# Patient Record
Sex: Male | Born: 1965
Health system: Southern US, Community
[De-identification: ages and names within clinical notes are randomized; demographics above are authoritative.]

## PROBLEM LIST (undated history)

## (undated) DIAGNOSIS — M549 Dorsalgia, unspecified: Secondary | ICD-10-CM

## (undated) DIAGNOSIS — E7800 Pure hypercholesterolemia, unspecified: Secondary | ICD-10-CM

## (undated) DIAGNOSIS — F329 Major depressive disorder, single episode, unspecified: Secondary | ICD-10-CM

## (undated) DIAGNOSIS — F32A Depression, unspecified: Secondary | ICD-10-CM

---

## 2007-11-28 ENCOUNTER — Emergency Department (HOSPITAL_COMMUNITY): Admission: EM | Admit: 2007-11-28 | Discharge: 2007-11-28 | Payer: Self-pay | Admitting: Emergency Medicine

## 2007-12-30 ENCOUNTER — Ambulatory Visit: Payer: Self-pay | Admitting: Family Medicine

## 2007-12-30 DIAGNOSIS — F329 Major depressive disorder, single episode, unspecified: Secondary | ICD-10-CM

## 2007-12-30 DIAGNOSIS — M546 Pain in thoracic spine: Secondary | ICD-10-CM

## 2008-01-08 LAB — CONVERTED CEMR LAB
Calcium: 10 mg/dL (ref 8.4–10.5)
Creatinine, Ser: 0.9 mg/dL (ref 0.4–1.5)
GFR calc Af Amer: 121 mL/min
GFR calc non Af Amer: 100 mL/min
Glucose, Bld: 85 mg/dL (ref 70–99)
MCHC: 34.2 g/dL (ref 30.0–36.0)
Platelets: 310 10*3/uL (ref 150–400)
RBC: 4.85 M/uL (ref 4.22–5.81)
RDW: 11.9 % (ref 11.5–14.6)
WBC: 4.3 10*3/uL — ABNORMAL LOW (ref 4.5–10.5)

## 2008-01-09 ENCOUNTER — Encounter (INDEPENDENT_AMBULATORY_CARE_PROVIDER_SITE_OTHER): Payer: Self-pay | Admitting: *Deleted

## 2008-03-23 ENCOUNTER — Emergency Department (HOSPITAL_COMMUNITY): Admission: EM | Admit: 2008-03-23 | Discharge: 2008-03-23 | Payer: Self-pay | Admitting: Family Medicine

## 2009-10-07 ENCOUNTER — Emergency Department (HOSPITAL_COMMUNITY): Admission: EM | Admit: 2009-10-07 | Discharge: 2009-10-07 | Payer: Self-pay | Admitting: Family Medicine

## 2010-03-31 ENCOUNTER — Ambulatory Visit (HOSPITAL_COMMUNITY)
Admission: RE | Admit: 2010-03-31 | Discharge: 2010-03-31 | Payer: Self-pay | Admitting: Physical Medicine and Rehabilitation

## 2010-03-31 ENCOUNTER — Encounter
Admission: RE | Admit: 2010-03-31 | Discharge: 2010-06-29 | Payer: Self-pay | Admitting: Physical Medicine and Rehabilitation

## 2010-11-29 ENCOUNTER — Ambulatory Visit (INDEPENDENT_AMBULATORY_CARE_PROVIDER_SITE_OTHER): Payer: 59

## 2010-11-29 ENCOUNTER — Inpatient Hospital Stay (INDEPENDENT_AMBULATORY_CARE_PROVIDER_SITE_OTHER)
Admission: RE | Admit: 2010-11-29 | Discharge: 2010-11-29 | Disposition: A | Discharge status: Home / Self Care | Payer: 59 | Source: Ambulatory Visit | Attending: Emergency Medicine | Admitting: Emergency Medicine

## 2010-11-29 DIAGNOSIS — M79609 Pain in unspecified limb: Secondary | ICD-10-CM

## 2011-06-28 ENCOUNTER — Other Ambulatory Visit (HOSPITAL_COMMUNITY): Payer: Self-pay | Admitting: Physical Medicine and Rehabilitation

## 2011-06-28 DIAGNOSIS — R52 Pain, unspecified: Secondary | ICD-10-CM

## 2011-07-05 ENCOUNTER — Ambulatory Visit (HOSPITAL_COMMUNITY)
Admission: RE | Admit: 2011-07-05 | Discharge: 2011-07-05 | Disposition: A | Discharge status: Home / Self Care | Payer: 59 | Source: Ambulatory Visit | Attending: Physical Medicine and Rehabilitation | Admitting: Physical Medicine and Rehabilitation

## 2011-07-05 DIAGNOSIS — R52 Pain, unspecified: Secondary | ICD-10-CM

## 2011-07-05 DIAGNOSIS — M546 Pain in thoracic spine: Secondary | ICD-10-CM | POA: Insufficient documentation

## 2011-09-19 ENCOUNTER — Encounter: Payer: Self-pay | Admitting: *Deleted

## 2011-09-19 MED ORDER — SODIUM CHLORIDE 0.9 % IV BOLUS (SEPSIS)
1000.0000 mL | Freq: Once | INTRAVENOUS | Status: AC
  Administered 2011-09-20: 1000 mL via INTRAVENOUS

## 2011-09-19 MED ORDER — ONDANSETRON HCL 4 MG/2ML IJ SOLN
4.0000 mg | Freq: Once | INTRAMUSCULAR | Status: AC
  Administered 2011-09-20: 4 mg via INTRAVENOUS
  Filled 2011-09-19: qty 2

## 2011-09-19 NOTE — ED Notes (Signed)
Pt presents to ED today with abd pain and n/v since 2pm.  Pt tried rx tramadol with no relief

## 2011-09-20 ENCOUNTER — Encounter (HOSPITAL_COMMUNITY): Admission: EM | Disposition: A | Discharge status: Home / Self Care | Payer: Self-pay | Source: Home / Self Care

## 2011-09-20 ENCOUNTER — Encounter (HOSPITAL_COMMUNITY): Payer: Self-pay | Admitting: Anesthesiology

## 2011-09-20 ENCOUNTER — Inpatient Hospital Stay (HOSPITAL_BASED_OUTPATIENT_CLINIC_OR_DEPARTMENT_OTHER)
Admission: EM | Admit: 2011-09-20 | Discharge: 2011-09-21 | DRG: 419 | Disposition: A | Discharge status: Home / Self Care | Payer: 59 | Attending: Surgery | Admitting: Surgery

## 2011-09-20 ENCOUNTER — Encounter (HOSPITAL_COMMUNITY): Payer: Self-pay | Admitting: Emergency Medicine

## 2011-09-20 ENCOUNTER — Emergency Department (INDEPENDENT_AMBULATORY_CARE_PROVIDER_SITE_OTHER): Payer: 59

## 2011-09-20 ENCOUNTER — Other Ambulatory Visit (INDEPENDENT_AMBULATORY_CARE_PROVIDER_SITE_OTHER): Payer: Self-pay | Admitting: General Surgery

## 2011-09-20 ENCOUNTER — Emergency Department (HOSPITAL_COMMUNITY): Payer: 59 | Admitting: Anesthesiology

## 2011-09-20 ENCOUNTER — Emergency Department (HOSPITAL_COMMUNITY): Payer: 59

## 2011-09-20 ENCOUNTER — Other Ambulatory Visit: Payer: Self-pay

## 2011-09-20 DIAGNOSIS — K8020 Calculus of gallbladder without cholecystitis without obstruction: Secondary | ICD-10-CM | POA: Diagnosis present

## 2011-09-20 DIAGNOSIS — K8010 Calculus of gallbladder with chronic cholecystitis without obstruction: Secondary | ICD-10-CM

## 2011-09-20 DIAGNOSIS — E7800 Pure hypercholesterolemia, unspecified: Secondary | ICD-10-CM | POA: Diagnosis present

## 2011-09-20 DIAGNOSIS — F329 Major depressive disorder, single episode, unspecified: Secondary | ICD-10-CM | POA: Diagnosis present

## 2011-09-20 DIAGNOSIS — F411 Generalized anxiety disorder: Secondary | ICD-10-CM | POA: Diagnosis present

## 2011-09-20 DIAGNOSIS — F3289 Other specified depressive episodes: Secondary | ICD-10-CM | POA: Diagnosis present

## 2011-09-20 DIAGNOSIS — R109 Unspecified abdominal pain: Secondary | ICD-10-CM

## 2011-09-20 DIAGNOSIS — M549 Dorsalgia, unspecified: Secondary | ICD-10-CM | POA: Diagnosis present

## 2011-09-20 DIAGNOSIS — K8000 Calculus of gallbladder with acute cholecystitis without obstruction: Principal | ICD-10-CM | POA: Diagnosis present

## 2011-09-20 DIAGNOSIS — G8929 Other chronic pain: Secondary | ICD-10-CM | POA: Diagnosis present

## 2011-09-20 DIAGNOSIS — R112 Nausea with vomiting, unspecified: Secondary | ICD-10-CM

## 2011-09-20 DIAGNOSIS — K8050 Calculus of bile duct without cholangitis or cholecystitis without obstruction: Secondary | ICD-10-CM

## 2011-09-20 HISTORY — DX: Depression, unspecified: F32.A

## 2011-09-20 HISTORY — PX: CHOLECYSTECTOMY: SHX55

## 2011-09-20 HISTORY — DX: Dorsalgia, unspecified: M54.9

## 2011-09-20 HISTORY — DX: Pure hypercholesterolemia, unspecified: E78.00

## 2011-09-20 HISTORY — DX: Major depressive disorder, single episode, unspecified: F32.9

## 2011-09-20 LAB — CBC
HCT: 40.1 % (ref 39.0–52.0)
MCH: 30.9 pg (ref 26.0–34.0)
MCV: 87.2 fL (ref 78.0–100.0)
RBC: 4.6 MIL/uL (ref 4.22–5.81)
RDW: 12.5 % (ref 11.5–15.5)
WBC: 12.6 10*3/uL — ABNORMAL HIGH (ref 4.0–10.5)

## 2011-09-20 LAB — COMPREHENSIVE METABOLIC PANEL
AST: 31 U/L (ref 0–37)
Albumin: 4.9 g/dL (ref 3.5–5.2)
Alkaline Phosphatase: 95 U/L (ref 39–117)
BUN: 9 mg/dL (ref 6–23)
Creatinine, Ser: 0.9 mg/dL (ref 0.50–1.35)
Glucose, Bld: 151 mg/dL — ABNORMAL HIGH (ref 70–99)
Potassium: 3.9 mEq/L (ref 3.5–5.1)
Sodium: 142 mEq/L (ref 135–145)

## 2011-09-20 LAB — DIFFERENTIAL
Basophils Absolute: 0 10*3/uL (ref 0.0–0.1)
Eosinophils Relative: 0 % (ref 0–5)
Lymphocytes Relative: 7 % — ABNORMAL LOW (ref 12–46)
Lymphs Abs: 0.9 10*3/uL (ref 0.7–4.0)
Monocytes Absolute: 0.8 10*3/uL (ref 0.1–1.0)
Monocytes Relative: 6 % (ref 3–12)

## 2011-09-20 LAB — URINALYSIS, ROUTINE W REFLEX MICROSCOPIC
Bilirubin Urine: NEGATIVE
Hgb urine dipstick: NEGATIVE
Leukocytes, UA: NEGATIVE
Nitrite: NEGATIVE
Urobilinogen, UA: 1 mg/dL (ref 0.0–1.0)

## 2011-09-20 SURGERY — LAPAROSCOPIC CHOLECYSTECTOMY
Anesthesia: General | Site: Abdomen | Wound class: Clean Contaminated

## 2011-09-20 MED ORDER — ONDANSETRON HCL 4 MG/2ML IJ SOLN
4.0000 mg | Freq: Four times a day (QID) | INTRAMUSCULAR | Status: DC | PRN
  Administered 2011-09-20: 4 mg via INTRAVENOUS
  Filled 2011-09-20 (×2): qty 2

## 2011-09-20 MED ORDER — LACTATED RINGERS IV SOLN: INTRAVENOUS | Status: DC

## 2011-09-20 MED ORDER — KETOROLAC TROMETHAMINE 30 MG/ML IJ SOLN: 15.0000 mg | Freq: Once | INTRAMUSCULAR | Status: DC | PRN

## 2011-09-20 MED ORDER — MORPHINE SULFATE 2 MG/ML IJ SOLN
2.0000 mg | INTRAMUSCULAR | Status: DC | PRN
  Administered 2011-09-20: 2 mg via INTRAVENOUS
  Filled 2011-09-20: qty 1

## 2011-09-20 MED ORDER — IOHEXOL 300 MG/ML  SOLN
INTRAMUSCULAR | Status: DC | PRN
  Administered 2011-09-20: 6 mL via INTRAVENOUS

## 2011-09-20 MED ORDER — ROCURONIUM BROMIDE 100 MG/10ML IV SOLN
INTRAVENOUS | Status: DC | PRN
  Administered 2011-09-20: 5 mg via INTRAVENOUS
  Administered 2011-09-20: 40 mg via INTRAVENOUS

## 2011-09-20 MED ORDER — IOHEXOL 300 MG/ML  SOLN
100.0000 mL | Freq: Once | INTRAMUSCULAR | Status: AC | PRN
  Administered 2011-09-20: 100 mL via INTRAVENOUS

## 2011-09-20 MED ORDER — INFLUENZA VIRUS VACC SPLIT PF IM SUSP
0.5000 mL | INTRAMUSCULAR | Status: AC
  Administered 2011-09-21: 0.5 mL via INTRAMUSCULAR
  Filled 2011-09-20: qty 0.5

## 2011-09-20 MED ORDER — BUPIVACAINE-EPINEPHRINE 0.25% -1:200000 IJ SOLN
INTRAMUSCULAR | Status: DC | PRN
  Administered 2011-09-20: 10 mL

## 2011-09-20 MED ORDER — ONDANSETRON HCL 4 MG PO TABS: 4.0000 mg | ORAL_TABLET | Freq: Four times a day (QID) | ORAL | Status: DC | PRN

## 2011-09-20 MED ORDER — POTASSIUM CHLORIDE IN NACL 20-0.9 MEQ/L-% IV SOLN
INTRAVENOUS | Status: DC
  Filled 2011-09-20 (×4): qty 1000

## 2011-09-20 MED ORDER — GLYCOPYRROLATE 0.2 MG/ML IJ SOLN
INTRAMUSCULAR | Status: DC | PRN
  Administered 2011-09-20: .7 mg via INTRAVENOUS

## 2011-09-20 MED ORDER — HYDROMORPHONE HCL PF 1 MG/ML IJ SOLN
1.0000 mg | INTRAMUSCULAR | Status: DC | PRN
  Administered 2011-09-20 (×5): 1 mg via INTRAVENOUS
  Filled 2011-09-20 (×4): qty 1

## 2011-09-20 MED ORDER — PROPOFOL 10 MG/ML IV BOLUS
INTRAVENOUS | Status: DC | PRN
  Administered 2011-09-20: 180 mg via INTRAVENOUS

## 2011-09-20 MED ORDER — PROMETHAZINE HCL 25 MG/ML IJ SOLN: 6.2500 mg | INTRAMUSCULAR | Status: DC | PRN

## 2011-09-20 MED ORDER — HYDROMORPHONE HCL PF 1 MG/ML IJ SOLN
INTRAMUSCULAR | Status: DC | PRN
  Administered 2011-09-20 (×2): 0.5 mg via INTRAVENOUS
  Administered 2011-09-20: 1 mg via INTRAVENOUS

## 2011-09-20 MED ORDER — LACTATED RINGERS IV SOLN
INTRAVENOUS | Status: DC | PRN
  Administered 2011-09-20: 10:00:00 via INTRAVENOUS

## 2011-09-20 MED ORDER — PIPERACILLIN-TAZOBACTAM 3.375 G IVPB 30 MIN
3.3750 g | INTRAVENOUS | Status: AC
  Administered 2011-09-20: 3.375 g via INTRAVENOUS
  Filled 2011-09-20: qty 50

## 2011-09-20 MED ORDER — NEOSTIGMINE METHYLSULFATE 1 MG/ML IJ SOLN
INTRAMUSCULAR | Status: DC | PRN
  Administered 2011-09-20: 4 mg via INTRAVENOUS

## 2011-09-20 MED ORDER — MIDAZOLAM HCL 5 MG/5ML IJ SOLN
INTRAMUSCULAR | Status: DC | PRN
  Administered 2011-09-20: 2 mg via INTRAVENOUS

## 2011-09-20 MED ORDER — ACETAMINOPHEN 10 MG/ML IV SOLN
INTRAVENOUS | Status: DC | PRN
  Administered 2011-09-20: 1000 mg via INTRAVENOUS

## 2011-09-20 MED ORDER — IOHEXOL 300 MG/ML  SOLN
INTRAMUSCULAR | Status: AC
  Filled 2011-09-20: qty 1

## 2011-09-20 MED ORDER — CITALOPRAM HYDROBROMIDE 20 MG PO TABS
20.0000 mg | ORAL_TABLET | Freq: Every day | ORAL | Status: DC
Start: 2011-09-20 — End: 2011-09-21
  Administered 2011-09-20 – 2011-09-21 (×2): 20 mg via ORAL
  Filled 2011-09-20 (×2): qty 1

## 2011-09-20 MED ORDER — MORPHINE SULFATE 2 MG/ML IJ SOLN
INTRAMUSCULAR | Status: AC
  Administered 2011-09-20: 2 mg via INTRAVENOUS
  Filled 2011-09-20: qty 1

## 2011-09-20 MED ORDER — EZETIMIBE 10 MG PO TABS
10.0000 mg | ORAL_TABLET | Freq: Every day | ORAL | Status: DC
Start: 2011-09-20 — End: 2011-09-21
  Administered 2011-09-20: 10 mg via ORAL
  Filled 2011-09-20 (×2): qty 1

## 2011-09-20 MED ORDER — METRONIDAZOLE 500 MG PO TABS: 500.0000 mg | ORAL_TABLET | Freq: Once | ORAL | Status: DC

## 2011-09-20 MED ORDER — MORPHINE SULFATE 2 MG/ML IJ SOLN
2.0000 mg | INTRAMUSCULAR | Status: DC | PRN
  Administered 2011-09-20: 2 mg via INTRAVENOUS

## 2011-09-20 MED ORDER — ROSUVASTATIN CALCIUM 40 MG PO TABS
40.0000 mg | ORAL_TABLET | Freq: Every day | ORAL | Status: DC
  Administered 2011-09-20: 40 mg via ORAL
  Filled 2011-09-20 (×2): qty 1

## 2011-09-20 MED ORDER — ONDANSETRON HCL 4 MG/2ML IJ SOLN
4.0000 mg | Freq: Four times a day (QID) | INTRAMUSCULAR | Status: DC | PRN
  Administered 2011-09-20: 4 mg via INTRAVENOUS

## 2011-09-20 MED ORDER — ONDANSETRON HCL 4 MG/2ML IJ SOLN
INTRAMUSCULAR | Status: DC | PRN
  Administered 2011-09-20: 4 mg via INTRAVENOUS

## 2011-09-20 MED ORDER — KETOROLAC TROMETHAMINE 30 MG/ML IJ SOLN
30.0000 mg | Freq: Once | INTRAMUSCULAR | Status: AC
  Administered 2011-09-20: 30 mg via INTRAVENOUS
  Filled 2011-09-20: qty 1

## 2011-09-20 MED ORDER — FENTANYL CITRATE 0.05 MG/ML IJ SOLN: 25.0000 ug | INTRAMUSCULAR | Status: DC | PRN

## 2011-09-20 MED ORDER — HYDROCODONE-ACETAMINOPHEN 5-325 MG PO TABS
1.0000 | ORAL_TABLET | ORAL | Status: DC | PRN
  Administered 2011-09-20: 2 via ORAL
  Administered 2011-09-20: 1 via ORAL
  Administered 2011-09-21 (×2): 2 via ORAL
  Filled 2011-09-20: qty 1
  Filled 2011-09-20 (×3): qty 2

## 2011-09-20 MED ORDER — SCOPOLAMINE 1 MG/3DAYS TD PT72
MEDICATED_PATCH | TRANSDERMAL | Status: DC | PRN
  Administered 2011-09-20: 1 via TRANSDERMAL

## 2011-09-20 MED ORDER — MORPHINE SULFATE 4 MG/ML IJ SOLN
4.0000 mg | Freq: Once | INTRAMUSCULAR | Status: AC
  Administered 2011-09-20: 4 mg via INTRAVENOUS
  Filled 2011-09-20: qty 1

## 2011-09-20 MED ORDER — LIDOCAINE HCL (CARDIAC) 20 MG/ML IV SOLN
INTRAVENOUS | Status: DC | PRN
  Administered 2011-09-20: 50 mg via INTRAVENOUS

## 2011-09-20 MED ORDER — MORPHINE SULFATE 4 MG/ML IJ SOLN
6.0000 mg | Freq: Once | INTRAMUSCULAR | Status: AC
  Administered 2011-09-20: 6 mg via INTRAVENOUS
  Filled 2011-09-20: qty 1

## 2011-09-20 MED ORDER — FENTANYL CITRATE 0.05 MG/ML IJ SOLN
INTRAMUSCULAR | Status: DC | PRN
  Administered 2011-09-20: 100 ug via INTRAVENOUS
  Administered 2011-09-20 (×3): 50 ug via INTRAVENOUS

## 2011-09-20 MED ORDER — KCL IN DEXTROSE-NACL 20-5-0.45 MEQ/L-%-% IV SOLN
INTRAVENOUS | Status: DC
  Administered 2011-09-20 – 2011-09-21 (×2): via INTRAVENOUS
  Filled 2011-09-20 (×5): qty 1000

## 2011-09-20 MED ORDER — PANTOPRAZOLE SODIUM 40 MG IV SOLR
40.0000 mg | Freq: Every day | INTRAVENOUS | Status: DC
  Administered 2011-09-20: 40 mg via INTRAVENOUS
  Filled 2011-09-20 (×2): qty 40

## 2011-09-20 MED ORDER — SODIUM CHLORIDE 0.9 % IV SOLN
Freq: Once | INTRAVENOUS | Status: AC
  Administered 2011-09-20: 06:00:00 via INTRAVENOUS

## 2011-09-20 MED ORDER — SCOPOLAMINE 1 MG/3DAYS TD PT72
MEDICATED_PATCH | TRANSDERMAL | Status: AC
  Filled 2011-09-20: qty 1

## 2011-09-20 MED ORDER — ACETAMINOPHEN 10 MG/ML IV SOLN
INTRAVENOUS | Status: AC
  Filled 2011-09-20: qty 100

## 2011-09-20 MED ORDER — PIPERACILLIN-TAZOBACTAM 3.375 G IVPB
3.3750 g | Freq: Three times a day (TID) | INTRAVENOUS | Status: DC
  Administered 2011-09-20 – 2011-09-21 (×3): 3.375 g via INTRAVENOUS
  Filled 2011-09-20 (×6): qty 50

## 2011-09-20 MED ORDER — HYDROMORPHONE HCL PF 1 MG/ML IJ SOLN: 1.0000 mg | Freq: Once | INTRAMUSCULAR | Status: DC

## 2011-09-20 MED ORDER — ACETAMINOPHEN 325 MG PO TABS: 650.0000 mg | ORAL_TABLET | ORAL | Status: DC | PRN

## 2011-09-20 MED ORDER — BUPIVACAINE-EPINEPHRINE PF 0.25-1:200000 % IJ SOLN
INTRAMUSCULAR | Status: AC
  Filled 2011-09-20: qty 30

## 2011-09-20 MED ORDER — DEXAMETHASONE SODIUM PHOSPHATE 10 MG/ML IJ SOLN
INTRAMUSCULAR | Status: DC | PRN
  Administered 2011-09-20: 10 mg via INTRAVENOUS

## 2011-09-20 MED ORDER — HYDROMORPHONE HCL PF 1 MG/ML IJ SOLN
INTRAMUSCULAR | Status: AC
  Filled 2011-09-20: qty 1

## 2011-09-20 MED ORDER — LACTATED RINGERS IV SOLN
INTRAVENOUS | Status: DC | PRN
  Administered 2011-09-20: 1000 mL via INTRAVENOUS

## 2011-09-20 SURGICAL SUPPLY — 32 items
APPLIER CLIP ROT 10 11.4 M/L (STAPLE) ×2
BENZOIN TINCTURE PRP APPL 2/3 (GAUZE/BANDAGES/DRESSINGS) ×2 IMPLANT
CANISTER SUCTION 2500CC (MISCELLANEOUS) ×2 IMPLANT
CLIP APPLIE ROT 10 11.4 M/L (STAPLE) ×1 IMPLANT
CLOTH BEACON ORANGE TIMEOUT ST (SAFETY) ×2 IMPLANT
COVER MAYO STAND STRL (DRAPES) IMPLANT
DECANTER SPIKE VIAL GLASS SM (MISCELLANEOUS) ×2 IMPLANT
DRAPE C-ARM 42X72 X-RAY (DRAPES) IMPLANT
DRAPE LAPAROSCOPIC ABDOMINAL (DRAPES) ×2 IMPLANT
ELECT REM PT RETURN 9FT ADLT (ELECTROSURGICAL) ×2
ELECTRODE REM PT RTRN 9FT ADLT (ELECTROSURGICAL) ×1 IMPLANT
GLOVE BIOGEL PI IND STRL 7.0 (GLOVE) ×1 IMPLANT
GLOVE BIOGEL PI INDICATOR 7.0 (GLOVE) ×1
GLOVE ORTHO TXT STRL SZ7.5 (GLOVE) ×2 IMPLANT
GOWN PREVENTION PLUS XLARGE (GOWN DISPOSABLE) ×2 IMPLANT
GOWN STRL NON-REIN LRG LVL3 (GOWN DISPOSABLE) ×2 IMPLANT
GOWN STRL REIN XL XLG (GOWN DISPOSABLE) ×2 IMPLANT
HEMOSTAT SURGICEL 4X8 (HEMOSTASIS) ×2 IMPLANT
KIT BASIN OR (CUSTOM PROCEDURE TRAY) ×2 IMPLANT
NS IRRIG 1000ML POUR BTL (IV SOLUTION) IMPLANT
POUCH SPECIMEN RETRIEVAL 10MM (ENDOMECHANICALS) IMPLANT
SET CHOLANGIOGRAPH MIX (MISCELLANEOUS) ×2 IMPLANT
SET IRRIG TUBING LAPAROSCOPIC (IRRIGATION / IRRIGATOR) ×2 IMPLANT
SOLUTION ANTI FOG 6CC (MISCELLANEOUS) ×2 IMPLANT
STRIP CLOSURE SKIN 1/2X4 (GAUZE/BANDAGES/DRESSINGS) ×2 IMPLANT
SUT VIC AB 0 UR5 27 (SUTURE) IMPLANT
SUT VIC AB 4-0 PS2 27 (SUTURE) ×2 IMPLANT
TRAY LAP CHOLE (CUSTOM PROCEDURE TRAY) ×2 IMPLANT
TROCAR BLADELESS OPT 5 75 (ENDOMECHANICALS) ×4 IMPLANT
TROCAR XCEL BLUNT TIP 100MML (ENDOMECHANICALS) ×2 IMPLANT
TROCAR XCEL NON-BLD 11X100MML (ENDOMECHANICALS) ×2 IMPLANT
TUBING INSUFFLATION 10FT LAP (TUBING) ×2 IMPLANT

## 2011-09-20 NOTE — Op Note (Signed)
Brett Espinoza                ACCOUNT NO.:  [id number]  MEDICAL RECORD NO.:  [id number]  LOCATION:  [facility]                         FACILITY:  [facility]  PHYSICIAN:  Brett Espinoza, M.D.DATE OF BIRTH:  01-30-1966  DATE OF PROCEDURE:  09/20/2011 DATE OF DISCHARGE:                              OPERATIVE REPORT   PREOPERATIVE DIAGNOSIS:  Acute cholecystitis with stones.  POSTOPERATIVE DIAGNOSIS:  Acute cholecystitis with stones.  OPERATIONS:  Laparoscopic cholecystectomy with cholangiogram.  ASSISTANT:  Nurse.  HISTORY:  Brett Espinoza is a 45 year old male who presented to the emergency room last evening with the following history.  He has had back pain over the last few weeks.  He has been on tramadol and then the pain is more in the upper portion of his back and then yesterday, he was having a lot more pain and presented to the emergency room late last night.  He was seen by the ER physician and they did laboratory studies and obtained an ultrasound of his gallbladder, and the gallbladder showed a thickened gallbladder with a large stone in it.  Dr. Derrell Espinoza was called and saw him earlier this morning about 6 a.m., and the patient had received Dilaudid on several occasions with still kind of writhing in pain.  He checked him out to me shortly after he saw him and said he was going to proceed with his lap gallbladder with cholangiogram today. The patient has been on tramadol and I saw him and he was definitely acutely tender.  He has 87% neutrophils.  His total white count was only about 13,000 and his hematocrit was normal.  His liver function studies were unremarkable and because of the OR schedule and since we did not think we will be able to make a cholangiogram with him, I elected to do him first.  The patient preop was given 3.75 g of Zosyn.  He has PAS stockings positioned on the OR table.  The permit had been signed and after endotracheal tube had been placed, oral  tube was placed to the stomach and then the abdomen was prepped around the port sites after clipping the hair.  With Betadine prep, he was then draped in a sterile manner and then the time-out was completed with the identification of the planned procedure etc.  The patient was draped and then a small incision was made below the umbilicus.  The fascia was identified, picked up between the 2 Kocher's after a little small opening and carefully entered into peritoneal cavity.  The pursestring sutures were placed and Hasson cannula introduced.  The gallbladder is acutely inflamed, kind of nearly hemorrhagic like it is pretty gangrenous and the upper 10-mm trocar was placed under direct vision after anesthetizing the fascia where two lateral 5-mm trocars were placed.  I first decompressed the gallbladder with the __________ aspirator and then we could grasp the gallbladder and it is kind of a thin-walled gallbladder and hopefully not to have to spill the stones.  At the more proximal portion of the gallbladder, you could see just basically where it was edematous and I counted carefully, entered into the little real fatty tissue and then there was  a little structure that I felt there was no way that it could be the cystic duct.  I clipped it doubly and then divided distal to the clips and it was a little blood vessel that I clipped digitally.  Under that was the proximal portion of the gallbladder and the cystic duct.  This was encompassed on a clip, was placed across the distal cystic duct gallbladder and then I was going to just clip and run, but there appears to me to be debris within the cystic duct and I removed the 2 proximal clips and milked out a bunch of soft stones.  Then after this, it was all milked out and the C-arm was available, a cook catheter was introduced and the cholangiogram obtained.  It showed no evidence of any dilatation of the extrahepatic biliary system and good flow  into the duodenum.  I then triply clipped the cystic duct and then divided it and then the gallbladder was kind of teased from its bed, it was hemorrhagic, was difficult because the gallbladder was nearly intrahepatic and there was a little area of the liver that was flopped in over the area making the exposure difficult. We had switched to the 30-degree scope and with the scope just kind of carefully dissecting freeing up the gallbladder from the gallbladder fossa.  I was able to completely free it up.  There were several little vessels that were probably the secondary little branches that we clipped under direct vision and then after the bed was free, irrigated and cauterized several areas, I put a piece of Surgicel in the gallbladder fossa.  The gallbladder had been placed in the EndoCatch bag and then we switched the camera to the upper 10-mm port and then I finger dissected free and opening up the fascia slightly, but in order to get the gallbladder with the stone in the bag, I had to open the fascia incision just a little bit up towards the umbilicus.  The bag containing the stone and gallbladder then could come through the fascia and I closed the fascia after I reinspected with Hasson port and satisfied with hemostasis.  The Surgicel was in good position.  I then switched the camera back to the upper 10-mm port.  I closed the fascia with additional figure-of-eight of 0 Vicryl and the pursestring tied on both appears to be a fascia closure tight and then anesthetized the fascia at the umbilicus.  The patient's port sites were withdrawn under direct vision after fluid was aspirated and then the subcutaneous wounds were closed with 4-0 Vicryl, Benzoin, and Steri-Strips on the skin.  The patient tolerated the procedure nicely.  I will make sure he gets 3-4 doses of antibiotics within the next 24 hours, and we will check liver function studies again in the morning.  I think his symptoms  will be resolved and hopefully some of this back pain that he was experiencing is possibly related to the acute cholecystitis with stones even though I do not think he has really passed a common duct stone.     Brett Pancoast. Zachery Dakins, M.D.     WJW/MEDQ  D:  09/20/2011  T:  09/20/2011  Job:  [id number]

## 2011-09-20 NOTE — H&P (Addendum)
Brett Espinoza is an 45 y.o. male.   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 It was rather sudden in onset. The pain persists and has stayed in the right upper quadrant. He's been nauseated and has vomited a couple of times. He denies diarrhea. This had a little but chills. No documented fever. No prior episodes. Evaluation at med center [facility] included a CT scan which shows gallstones but no obvious inflammation of the gallbladder. It also shows a bulbous pancreatic tip suggesting a possible accessory spleen. Liver spleen scan was suggested at a later date. This was discussed with the patient.  His pain could not be controlled or resolved. I was called. He was transferred to [facility] emergency room for consideration for cholecystectomy.  He is awake and alert but still has pain in the right upper quadrant.  Past history is significant only for anxiety and depression, hyperlipidemia, and chronic back pain for which he is followed at [facility] regional pain clinic.    Past Medical History  Diagnosis Date  . Hypercholesterolemia   . Back pain   . Depression     History reviewed. No pertinent past surgical history.  Family History  Problem Relation Age of Onset  . Cancer Mother   . Cancer Father    Social History:  reports that he has never smoked. He has never used smokeless tobacco. He reports that he drinks about 3.6 ounces of alcohol per week. He reports that he does not use illicit drugs.  Allergies: No Known Allergies  Medications Prior to Admission  Medication Dose Route Frequency Provider Last Rate Last Dose  . 0.9 %  sodium chloride infusion   Intravenous Once Lyanne Co, MD 150 mL/hr at 09/20/11 1610    . HYDROmorphone (DILAUDID) injection 1 mg  1 mg Intravenous Q2H PRN Lyanne Co, MD   1 mg at  09/20/11 0203  . iohexol (OMNIPAQUE) 300 MG/ML solution 100 mL  100 mL Intravenous Once PRN Medication Radiologist   100 mL at 09/20/11 0236  . ketorolac (TORADOL) 30 MG/ML injection 30 mg  30 mg Intravenous Once Lyanne Co, MD   30 mg at 09/20/11 0310  . morphine 2 MG/ML injection        2 mg at 09/20/11 0319  . morphine 4 MG/ML injection 4 mg  4 mg Intravenous Once Lyanne Co, MD   4 mg at 09/20/11 0500  . morphine 4 MG/ML injection 6 mg  6 mg Intravenous Once Lyanne Co, MD   6 mg at 09/20/11 9604  . ondansetron (ZOFRAN) injection 4 mg  4 mg Intravenous Once Lyanne Co, MD   4 mg at 09/20/11 0006  . sodium chloride 0.9 % bolus 1,000 mL  1,000 mL Intravenous Once Lyanne Co, MD   1,000 mL at 09/20/11 0006  . DISCONTD: HYDROmorphone (DILAUDID) injection 1 mg  1 mg Intravenous Once Lyanne Co, MD      . DISCONTD: metroNIDAZOLE (FLAGYL) tablet 500 mg  500 mg Oral Once Lyanne Co, MD       No current outpatient prescriptions on file as of 09/19/2011.    Results for orders placed during the hospital encounter of 09/20/11 (from the past 48 hour(s))  CBC     Status: Abnormal   Collection Time   09/19/11  11:46 PM      Component Value Range Comment   WBC 12.6 (*) 4.0 - 10.5 (K/uL) WHITE COUNT CONFIRMED ON SMEAR   RBC 4.60  4.22 - 5.81 (MIL/uL)    Hemoglobin 14.2  13.0 - 17.0 (g/dL)    HCT 40.9  81.1 - 91.4 (%)    MCV 87.2  78.0 - 100.0 (fL)    MCH 30.9  26.0 - 34.0 (pg)    MCHC 35.4  30.0 - 36.0 (g/dL)    RDW 78.2  95.6 - 21.3 (%)    Platelets 411 (*) 150 - 400 (K/uL) PLATELET COUNT CONFIRMED BY SMEAR  DIFFERENTIAL     Status: Abnormal   Collection Time   09/19/11 11:46 PM      Component Value Range Comment   Neutrophils Relative 87 (*) 43 - 77 (%)    Lymphocytes Relative 7 (*) 12 - 46 (%)    Monocytes Relative 6  3 - 12 (%)    Eosinophils Relative 0  0 - 5 (%)    Basophils Relative 0  0 - 1 (%)    Neutro Abs 10.9 (*) 1.7 - 7.7 (K/uL)    Lymphs Abs 0.9  0.7  - 4.0 (K/uL)    Monocytes Absolute 0.8  0.1 - 1.0 (K/uL)    Eosinophils Absolute 0.0  0.0 - 0.7 (K/uL)    Basophils Absolute 0.0  0.0 - 0.1 (K/uL)    WBC Morphology WHITE COUNT CONFIRMED ON SMEAR      Smear Review PLATELET COUNT CONFIRMED BY SMEAR   MORPHOLOGY UNREMARKABLE  COMPREHENSIVE METABOLIC PANEL     Status: Abnormal   Collection Time   09/19/11 11:46 PM      Component Value Range Comment   Sodium 142  135 - 145 (mEq/L)    Potassium 3.9  3.5 - 5.1 (mEq/L)    Chloride 103  96 - 112 (mEq/L)    CO2 25  19 - 32 (mEq/L)    Glucose, Bld 151 (*) 70 - 99 (mg/dL)    BUN 9  6 - 23 (mg/dL)    Creatinine, Ser 0.86  0.50 - 1.35 (mg/dL)    Calcium 57.8  8.4 - 10.5 (mg/dL)    Total Protein 8.5 (*) 6.0 - 8.3 (g/dL)    Albumin 4.9  3.5 - 5.2 (g/dL)    AST 31  0 - 37 (U/L)    ALT 51  0 - 53 (U/L)    Alkaline Phosphatase 95  39 - 117 (U/L)    Total Bilirubin 0.4  0.3 - 1.2 (mg/dL)    GFR calc non Af Amer >90  >90 (mL/min)    GFR calc Af Amer >90  >90 (mL/min)   LIPASE, BLOOD     Status: Normal   Collection Time   09/19/11 11:46 PM      Component Value Range Comment   Lipase 38  11 - 59 (U/L)   URINALYSIS, ROUTINE W REFLEX MICROSCOPIC     Status: Abnormal   Collection Time   09/20/11  3:01 AM      Component Value Range Comment   Color, Urine YELLOW  YELLOW     APPearance CLEAR  CLEAR     Specific Gravity, Urine >1.046 (*) 1.005 - 1.030     pH 7.0  5.0 - 8.0     Glucose, UA NEGATIVE  NEGATIVE (mg/dL)    Hgb urine dipstick NEGATIVE  NEGATIVE     Bilirubin Urine NEGATIVE  NEGATIVE  Ketones, ur NEGATIVE  NEGATIVE (mg/dL)    Protein, ur NEGATIVE  NEGATIVE (mg/dL)    Urobilinogen, UA 1.0  0.0 - 1.0 (mg/dL)    Nitrite NEGATIVE  NEGATIVE     Leukocytes, UA NEGATIVE  NEGATIVE  MICROSCOPIC NOT DONE ON URINES WITH NEGATIVE PROTEIN, BLOOD, LEUKOCYTES, NITRITE, OR GLUCOSE <1000 mg/dL.   Ct Abdomen Pelvis W Contrast  09/20/2011  *RADIOLOGY REPORT*  Clinical Data: Right-sided abdominal pain,  nausea and vomiting  CT ABDOMEN AND PELVIS WITH CONTRAST  Technique:  Multidetector CT imaging of the abdomen and pelvis was performed following the standard protocol during bolus administration of intravenous contrast.  Contrast: OMNIPAQUE IOHEXOL 300 MG/ML IV SOLN  Comparison: None.  Findings: Mild dependent changes in the lung bases.  Cholelithiasis with mild gallbladder distension.  No significant gallbladder wall thickening or stranding.  No bile duct dilatation.  The liver, spleen, adrenal glands, kidneys, stomach, small bowel, abdominal aorta, and retroperitoneal lymph nodes are unremarkable.  The tail the pancreas has a somewhat bulbous tip, measuring about 2.6 x 3.5 cm. The appearance and enhancement characteristics are similar to the splenic parenchyma and this could be an adherent accessory spleen but a distal pancreatic mass is not entirely excluded.  The liver spleen scan could be obtained to differentiate between these two diagnoses. No free fluid or free air in the abdomen.  Pelvis:  The prostate gland is not enlarged.  Bladder wall is not thickened.  Scattered diverticula in the sigmoid colon without inflammatory change.  No free or loculated pelvic fluid collections.  The appendix is normal.  Normal alignment of the lumbar vertebrae.  IMPRESSION: Cholelithiasis without obvious cholecystitis change.  Nonspecific bulbous appearance to the tail of the pancreas could represent adherent accessory spleen.  Consider liver spleen scan to differentiate between pancreatic mass or accessory spleen.  Original Report Authenticated By: Marlon Pel, M.D.    ROS 12 system review of systems is performed and is negative except as described above.Blood pressure 129/82, pulse 61, temperature 98 F (36.7 C), temperature source Oral, resp. rate 17, height 5\' 11"  (1.803 m), weight 200 lb (90.719 kg), SpO2 97.00%. Physical Exam  Constitutional: He is awake alert cooperative with good insight. Eyes:                Sclerae are clear extraocular movements intact HEENT:           Ears nose mouth and throat are without gross lesions. Neck:               Supple nontender without masses without jugular venous distention Lungs:              Clear to auscultation. No chest wall tenderness. Heart:               Regular rate and rhythm. No murmur. Radial and femoral pulses palpable. No peripheral edema. Abdomen          tender with guarding in the right upper quadrant. No mass. Soft elsewhere. No scars. No organomegaly. No hernia. Extremities:      Moves all 4 extremities well without pain or deformity Neurologic:      No gross motor suture deficits.    Assessment/Plan Acute cholecystitis with cholelithiasis.  vague bulbous swelling of tail of pancreas. Possible accessory spleen. Doubt that this has anything to do with his current pain. Patient is aware of this.  Chronic anxiety and depression..  Hyperlipidemia  Chronic back pain, followed [facility]  pain clinic  Because the patient continues to have pain and tenderness he will be admitted to the hospital, started on antibiotics, and taken to the operating room within the next 24 hours for cholecystectomy.  I discussed the indications and details of cholecystectomy with him. Risks and complications have been outlined, including but not limited to bleeding, infection, conversion to open laparotomy, bile leak, injury to adjacent organs such as the intestine or bile duct with major reconstructive surgery, cardiac, pulmonary, and thromboembolic problems. He understands these issues well. At this time his questions were answered. He agrees with this plan.  He is aware that his surgery will probably be done by one of my partners either later today or tomorrow. He is agreeable with this.  Loring Liskey M 09/20/2011, 6:49 AM

## 2011-09-20 NOTE — Progress Notes (Signed)
ANTIBIOTIC CONSULT NOTE - INITIAL  Pharmacy Consult for Zosyn Indication: Acute cholecystitis with cholelithiasis  No Known Allergies  Patient Measurements: Height: 5\' 11"  (180.3 cm) Weight: 200 lb (90.719 kg) IBW/kg (Calculated) : 75.3  Adjusted Body Weight: 81kg  Vital Signs: Temp: 98 F (36.7 C) (12/12 0607) Temp src: Oral (12/12 0607) BP: 129/82 mmHg (12/12 0607) Pulse Rate: 61  (12/12 0607) Intake/Output from previous day:   Intake/Output from this shift:    Labs:  Basename 09/19/11 2346  WBC 12.6*  HGB 14.2  PLT 411*  LABCREA --  CREATININE 0.90   Estimated Creatinine Clearance: 122 ml/min (by C-G formula based on Cr of 0.9).  Microbiology: No results found for this or any previous visit (from the past 720 hour(s)).  Medical History: Past Medical History  Diagnosis Date  . Hypercholesterolemia   . Back pain   . Depression     Medications:  Dilaudid Morphine Zofran Protonix Normal Saline  Assessment:  45 y.o. Male with likely acute cholecystitis with cholelithiasis, to begin broad spectrum antibiotics, and plan cholecystectomy later today  Since renal function is normal, will use standard dose zosyn.  Goal of Therapy:  Adequate prophylaxis of infection  Plan:  Zosyn 3.375gm IV q8h (4hr extended infusions) Monitor renal function & adjust if needed   Rollene Fare 09/20/2011,8:03 AM Pager: [phone]

## 2011-09-20 NOTE — ED Provider Notes (Signed)
History     CSN: [id number] Arrival date & time: 09/19/2011 11:20 PM   First MD Initiated Contact with Patient 09/19/11 2305      Chief Complaint  Patient presents with  . Abdominal Pain  . Emesis    (Consider location/radiation/quality/duration/timing/severity/associated sxs/prior treatment) HPI The patient reports developing acute onset right upper quadrant abdominal pain with associated nausea vomiting since 2 PM today.  He's vomited several times.  He denies fever and chills.  He denies diarrhea.  He's never had these symptoms before.  His pain is worsened by movement and palpation.  There is improved by nothing.  His vomit his been nonbloody nonbilious.  He is otherwise healthy and has never been told he has gallstones.  He reports the pain originally started in his right upper back however he denies radiation down into the right groin.  His pain at this time is severe.   History reviewed. No pertinent past medical history.  History reviewed. No pertinent past surgical history.  No family history on file.  History  Substance Use Topics  . Smoking status: Never Smoker   . Smokeless tobacco: Not on file  . Alcohol Use:       Review of Systems  All other systems reviewed and are negative.    Allergies  Review of patient's allergies indicates no known allergies.  Home Medications   Current Outpatient Rx  Name Route Sig Dispense Refill  . CITALOPRAM HYDROBROMIDE 20 MG PO TABS Oral Take 20 mg by mouth daily.      . DEPLIN 15 MG PO TABS Oral Take 1 tablet by mouth daily.      . TRAMADOL HCL 50 MG PO TABS Oral Take 100 mg by mouth 4 (four) times daily. Maximum dose= 8 tablets per day       BP 144/87  Pulse 60  Resp 15  Ht 5\' 11"  (1.803 m)  Wt 200 lb (90.719 kg)  BMI 27.89 kg/m2  SpO2 97%  Physical Exam  Nursing note and vitals reviewed. Constitutional: He is oriented to person, place, and time. He appears well-developed and well-nourished. He appears  distressed.       Uncomfortable appearing  HENT:  Head: Normocephalic and atraumatic.  Eyes: EOM are normal.  Neck: Normal range of motion.  Cardiovascular: Normal rate, regular rhythm, normal heart sounds and intact distal pulses.   Pulmonary/Chest: Effort normal and breath sounds normal. No respiratory distress.  Abdominal: Soft. He exhibits no distension.       Tenderness without guarding in his right upper quadrant.  No lower abdominal tenderness  Musculoskeletal: Normal range of motion.  Neurological: He is alert and oriented to person, place, and time.  Skin: Skin is warm and dry.  Psychiatric: He has a normal mood and affect. Judgment normal.    ED Course  Procedures (including critical care time)  Labs Reviewed  URINALYSIS, ROUTINE W REFLEX MICROSCOPIC - Abnormal; Notable for the following:    Specific Gravity, Urine >1.046 (*)    All other components within normal limits  CBC - Abnormal; Notable for the following:    WBC 12.6 (*) WHITE COUNT CONFIRMED ON SMEAR   Platelets 411 (*) PLATELET COUNT CONFIRMED BY SMEAR   All other components within normal limits  DIFFERENTIAL - Abnormal; Notable for the following:    Neutrophils Relative 87 (*)    Lymphocytes Relative 7 (*)    Neutro Abs 10.9 (*)    All other components within normal limits  COMPREHENSIVE  METABOLIC PANEL - Abnormal; Notable for the following:    Glucose, Bld 151 (*)    Total Protein 8.5 (*)    All other components within normal limits  LIPASE, BLOOD   Ct Abdomen Pelvis W Contrast  09/20/2011  *RADIOLOGY REPORT*  Clinical Data: Right-sided abdominal pain, nausea and vomiting  CT ABDOMEN AND PELVIS WITH CONTRAST  Technique:  Multidetector CT imaging of the abdomen and pelvis was performed following the standard protocol during bolus administration of intravenous contrast.  Contrast: OMNIPAQUE IOHEXOL 300 MG/ML IV SOLN  Comparison: None.  Findings: Mild dependent changes in the lung bases.   Cholelithiasis with mild gallbladder distension.  No significant gallbladder wall thickening or stranding.  No bile duct dilatation.  The liver, spleen, adrenal glands, kidneys, stomach, small bowel, abdominal aorta, and retroperitoneal lymph nodes are unremarkable.  The tail the pancreas has a somewhat bulbous tip, measuring about 2.6 x 3.5 cm. The appearance and enhancement characteristics are similar to the splenic parenchyma and this could be an adherent accessory spleen but a distal pancreatic mass is not entirely excluded.  The liver spleen scan could be obtained to differentiate between these two diagnoses. No free fluid or free air in the abdomen.  Pelvis:  The prostate gland is not enlarged.  Bladder wall is not thickened.  Scattered diverticula in the sigmoid colon without inflammatory change.  No free or loculated pelvic fluid collections.  The appendix is normal.  Normal alignment of the lumbar vertebrae.  IMPRESSION: Cholelithiasis without obvious cholecystitis change.  Nonspecific bulbous appearance to the tail of the pancreas could represent adherent accessory spleen.  Consider liver spleen scan to differentiate between pancreatic mass or accessory spleen.  Original Report Authenticated By: Marlon Pel, M.D.   I personally reviewed the CT scan  1. Biliary colic       MDM  I suspect this is biliary colic.  I do not have the ultrasound capabilities here at night however on CT scan he does have evidence of cholelithiasis without CT evidence of cholecystitis.  Him having a difficult time getting the patient's pain under control this I will discuss the case with the general surgeon for admission for treatment of his biliary colic and likely cholecystectomy.  The patient has received a total of 4 mg of IV Dilaudid 6 mg of IV morphine with improvement in his pain however he still continues to have pain and mild tenderness on exam.  His white count is 12,000  4:40 AM Spoke with Dr Claud Kelp who accepts the patient in transfer        Lyanne Co, MD 09/20/11 [phone]

## 2011-09-20 NOTE — ED Notes (Signed)
Pt to OR with OR nurse.

## 2011-09-20 NOTE — Anesthesia Preprocedure Evaluation (Addendum)
Anesthesia Evaluation  Patient identified by MRN, date of birth, ID band Patient awake    Reviewed: Allergy & Precautions, H&P , NPO status , Patient's Chart, lab work & pertinent test results  Airway Mallampati: II TM Distance: >3 FB Neck ROM: Full    Dental No notable dental hx.    Pulmonary neg pulmonary ROS,  clear to auscultation  Pulmonary exam normal       Cardiovascular neg cardio ROS Regular Normal    Neuro/Psych Negative Neurological ROS  Negative Psych ROS   GI/Hepatic negative GI ROS, Neg liver ROS,   Endo/Other  Negative Endocrine ROS  Renal/GU negative Renal ROS  Genitourinary negative   Musculoskeletal negative musculoskeletal ROS (+)   Abdominal   Peds negative pediatric ROS (+)  Hematology negative hematology ROS (+)   Anesthesia Other Findings   Reproductive/Obstetrics negative OB ROS                           Anesthesia Physical Anesthesia Plan  ASA: I  Anesthesia Plan: General   Post-op Pain Management:    Induction: Intravenous  Airway Management Planned:   Additional Equipment:   Intra-op Plan:   Post-operative Plan: Extubation in OR  Informed Consent: I have reviewed the patients History and Physical, chart, labs and discussed the procedure including the risks, benefits and alternatives for the proposed anesthesia with the patient or authorized representative who has indicated his/her understanding and acceptance.   Dental advisory given  Plan Discussed with: CRNA  Anesthesia Plan Comments:         Anesthesia Quick Evaluation  

## 2011-09-20 NOTE — Progress Notes (Signed)
Pt arrived to floor on stretcher, moved to bed under own power.  Oriented to callbell and environment. POC discussed. Assessment as charted, VSS. Dsgs to abd x 4 c./d/i. Pt A&Ox3, sipping ginger ale.

## 2011-09-20 NOTE — ED Notes (Signed)
Pt given a fourth dose of dilaudid for pain 8/10. Pt is alert and oriented. Drank all of oral contrast and aware he will go to CT shortly.

## 2011-09-20 NOTE — ED Notes (Signed)
Pt attempted to urinate and still unable to. Pt aware of plans for CT scan. Pain more tolerable at 6/10. Will continue to monitor.

## 2011-09-20 NOTE — ED Notes (Signed)
Pt presented to ed via [facility] c/o ruq pain associated with vomiting since yesterday @ 2 pm, medicated @ [facility], denies nausea (-) vomiting,  ruq pain scale 5/10, dr Derrell Lolling notified of arrival

## 2011-09-20 NOTE — Anesthesia Postprocedure Evaluation (Signed)
  Anesthesia Post-op Note  Patient: Brett Espinoza  Procedure(s) Performed:  LAPAROSCOPIC CHOLECYSTECTOMY - with ioc  Patient Location: PACU  Anesthesia Type: General  Level of Consciousness: awake and alert   Airway and Oxygen Therapy: Patient Spontanous Breathing  Post-op Pain: mild  Post-op Assessment: Post-op Vital signs reviewed, Patient's Cardiovascular Status Stable, Respiratory Function Stable, Patent Airway and No signs of Nausea or vomiting  Post-op Vital Signs: stable  Complications: No apparent anesthesia complications

## 2011-09-20 NOTE — ED Notes (Signed)
Seen and evaluated by dr Derrell Lolling for surgical evaluation

## 2011-09-20 NOTE — ED Notes (Signed)
[facility] arrived for transport. Called Camera operator at [facility] ED to make her aware of pt's arrival and to call Dr Derrell Lolling upon arrival.

## 2011-09-20 NOTE — Brief Op Note (Signed)
09/20/2011  11:49 AM  PATIENT:  Kashawn A Reddix  45 y.o. male  PRE-OPERATIVE DIAGNOSIS:  acute gallbladder with stones  POST-OPERATIVE DIAGNOSIS:  acute gallbladder with stones  PROCEDURE:  Procedure(s): LAPAROSCOPIC CHOLECYSTECTOMY with xray  SURGEON:  Surgeon(s): Iona Coach, MD  PHYSICIAN ASSISTANT: nurse  ASSISTANTS: none   ANESTHESIA:   general  EBL:  Total I/O In: 1050 [I.V.:1000; IV Piggyback:50] Out: 50 [Blood:50]  BLOOD ADMINISTERED:none  DRAINS: none   LOCAL MEDICATIONS USED:  MARCAINE 20CC  SPECIMEN:  Excision  DISPOSITION OF SPECIMEN:  PATHOLOGY  COUNTS:  YES  TOURNIQUET:  * No tourniquets in log *  DICTATION: .Other Dictation: Dictation Number [id number]  PLAN OF CARE: Admit for overnight observation  PATIENT DISPOSITION:  PACU - hemodynamically stable.

## 2011-09-20 NOTE — Transfer of Care (Signed)
Immediate Anesthesia Transfer of Care Note  Patient: Brett Espinoza  Procedure(s) Performed:  LAPAROSCOPIC CHOLECYSTECTOMY - with ioc  Patient Location: PACU  Anesthesia Type: General  Level of Consciousness: sedated, patient cooperative and responds to stimulaton  Airway & Oxygen Therapy: Patient Spontanous Breathing and Patient connected to face mask oxgen  Post-op Assessment: Report given to PACU RN and Post -op Vital signs reviewed and stable  Post vital signs: Reviewed and stable  Complications: No apparent anesthesia complications

## 2011-09-20 NOTE — ED Notes (Signed)
ZOX:[id number]<AV> Expected date:09/20/11<BR> Expected time: 5:07 AM<BR> Means of arrival:Ambulance<BR> Comments:<BR> Cholecystitis call Dr Derrell Lolling

## 2011-09-20 NOTE — ED Notes (Signed)
Pt reports pain has not improved since first dose of dilaudid. Requesting 2nd dose.

## 2011-09-21 ENCOUNTER — Encounter (HOSPITAL_COMMUNITY): Payer: Self-pay | Admitting: General Surgery

## 2011-09-21 MED ORDER — HYDROCODONE-ACETAMINOPHEN 5-325 MG PO TABS: 1.0000 | ORAL_TABLET | ORAL | Status: AC | PRN

## 2011-09-21 NOTE — Discharge Summary (Signed)
Physician Discharge Summary  Patient ID: Brett Espinoza MRN: [id number] DOB/AGE: 03/10/1966 45 y.o.  Admit date: 09/20/2011 Discharge date: 09/21/2011  Admission Diagnoses: Symptomatic gallstones Discharge Diagnoses:  Principal Problem:  *Gallstones and cholecystitis  Procedure(s): LAPAROSCOPIC CHOLECYSTECTOMY  Discharged Condition: good  Hospital Course: Pt admitted on 09/20/11 with c/o abd pain. Found to have gallstones on workup. Seen bu Dr. Derrell Lolling and surgery discussed. Taken to OR on 12/12 by Dr. Zachery Dakins for lap chole. Tolerated well, no intra-op or post-op issues.  Consults: none   Discharge Exam: Blood pressure 98/61, pulse 48, temperature 97.9 F (36.6 C), temperature source Oral, resp. rate 18, height 5\' 11"  (1.803 m), weight 200 lb (90.719 kg), SpO2 96.00%. Lungs: CTA without w/r/r Heart: Regular Abdomen: soft, ND, appropriately tender   Incisions all c/d/i without erythema or hematoma. Ext: No edema or tenderness   Disposition: Final discharge disposition not confirmed  Discharge Orders    Future Orders Please Complete By Expires   Diet - low sodium heart healthy      Increase activity slowly      May shower / Bathe      Remove dressing in 24 hours      Comments:   Remove tape and gauze, leave steristrips in place.   Call MD for:  redness, tenderness, or signs of infection (pain, swelling, redness, odor or green/yellow discharge around incision site)      Call MD for:  severe uncontrolled pain      Call MD for:  persistant nausea and vomiting      Call MD for:  temperature >100.4        Current Discharge Medication List    START taking these medications   Details  HYDROcodone-acetaminophen (NORCO) 5-325 MG per tablet Take 1-2 tablets by mouth every 4 (four) hours as needed. Qty: 30 tablet, Refills: 0      CONTINUE these medications which have NOT CHANGED   Details  citalopram (CELEXA) 20 MG tablet Take 20 mg by mouth daily.      ezetimibe  (ZETIA) 10 MG tablet Take 10 mg by mouth daily after supper.     L-Methylfolate (DEPLIN) 15 MG TABS Take 1 tablet by mouth daily.      rosuvastatin (CRESTOR) 10 MG tablet Take 40 mg by mouth daily after supper.     traMADol (ULTRAM) 50 MG tablet Take 100 mg by mouth 4 (four) times daily. Maximum dose= 8 tablets per day        Follow-up Information    Follow up with CCS,MD on 10/11/2011. ([facility] 2:30pm, may reschedule if conflict)    Contact information:   [facility] [address] [place] [address] [phone]          Signed: Iona Coach 09/21/2011, 9:50 AM

## 2011-09-21 NOTE — Progress Notes (Signed)
Patient given discharge instructions and prescriptions, verbalized understanding of instructions. Iv site removed, catheter tip intact. Left unit in wheelchair accompanied by staff and wife.

## 2011-10-11 ENCOUNTER — Ambulatory Visit (INDEPENDENT_AMBULATORY_CARE_PROVIDER_SITE_OTHER): Payer: Commercial Managed Care - PPO | Admitting: Radiology

## 2011-10-11 ENCOUNTER — Encounter (INDEPENDENT_AMBULATORY_CARE_PROVIDER_SITE_OTHER): Payer: Self-pay | Admitting: Radiology

## 2011-10-11 VITALS — BP 122/88 | HR 72 | Temp 97.9°F | Resp 16 | Ht 71.0 in | Wt 188.8 lb

## 2011-10-11 DIAGNOSIS — K819 Cholecystitis, unspecified: Secondary | ICD-10-CM

## 2011-10-11 NOTE — Progress Notes (Signed)
Brett Espinoza Jan 29, 1966 [id number] 10/11/2011   Brett Espinoza is a 46 y.o. male who had a laparoscopic cholecystectomy with intraoperative cholangiogram.  The pathology report confirmed cholecystitis.  The patient reports that they are feeling well with normal bowel movements and good appetite.  The pre-operative symptoms of abdominal pain, nausea, and vomiting have resolved.    Physical examination - Incisions appear well-healed with no sign of infection or bleeding.   Abdomen - soft, non-tender  Impression:  s/p laparoscopic cholecystectomy  Plan:  He may resume a regular diet and full activity.  He may follow-up on a PRN basis.

## 2012-06-20 ENCOUNTER — Emergency Department (HOSPITAL_COMMUNITY)
Admission: EM | Admit: 2012-06-20 | Discharge: 2012-06-20 | Disposition: A | Discharge status: Home / Self Care | Payer: 59 | Source: Home / Self Care | Attending: Emergency Medicine | Admitting: Emergency Medicine

## 2012-06-20 ENCOUNTER — Encounter (HOSPITAL_COMMUNITY): Payer: Self-pay | Admitting: Emergency Medicine

## 2012-06-20 DIAGNOSIS — H9201 Otalgia, right ear: Secondary | ICD-10-CM

## 2012-06-20 DIAGNOSIS — H6120 Impacted cerumen, unspecified ear: Secondary | ICD-10-CM

## 2012-06-20 MED ORDER — NEOMYCIN-POLYMYXIN-HC 1 % OT SOLN: 3.0000 [drp] | Freq: Three times a day (TID) | OTIC | Status: AC

## 2012-06-20 NOTE — ED Provider Notes (Signed)
History     CSN: [id number]  Arrival date & time 06/20/12  1129   First MD Initiated Contact with Patient 06/20/12 1140      Chief Complaint  Patient presents with  . Otalgia    (Consider location/radiation/quality/duration/timing/severity/associated sxs/prior treatment) HPI Comments: Patient presents urgent care this afternoon complaining of the sensation of R ear fullness for about 10 days, but most recently pressure and discomfort for the last 4 days. Patient denies any recent colds or sinus congestion or allergies. Does admit of cleaning his ears frequently with Q-tips. Denies any recent bleeding or recalling that he has injured his ear canal in any way. Patient denies any fevers, hearing loss, vertigo, feeling nauseous her headaches. Describes he has no discomfort on his left ear.  Patient is a 46 y.o. male presenting with ear pain. The history is provided by the patient.  Otalgia This is a new problem. The current episode started more than 2 days ago. There is pain in the right ear. The problem occurs constantly. The problem has not changed since onset.There has been no fever. The pain is moderate. Pertinent negatives include no ear discharge, no headaches, no hearing loss, no rhinorrhea, no sore throat, no neck pain, no cough and no rash. His past medical history does not include chronic ear infection, hearing loss or tympanostomy tube.    Past Medical History  Diagnosis Date  . Hypercholesterolemia   . Back pain   . Depression   . Back pain     Past Surgical History  Procedure Date  . Cholecystectomy 09/20/2011    Procedure: LAPAROSCOPIC CHOLECYSTECTOMY;  Surgeon: Iona Coach, MD;  Location: [facility];  Service: General;  Laterality: N/A;  with ioc    Family History  Problem Relation Age of Onset  . Cancer Mother     bladder  . Cancer Father     prostate  . Heart disease Father   . Heart disease Paternal Uncle     History  Substance Use Topics  . Smoking  status: Never Smoker   . Smokeless tobacco: Never Used  . Alcohol Use: 3.6 oz/week    3 Glasses of wine, 3 Cans of beer per week      Review of Systems  Constitutional: Negative for activity change and appetite change.  HENT: Positive for ear pain. Negative for hearing loss, nosebleeds, congestion, sore throat, facial swelling, rhinorrhea, neck pain, neck stiffness and ear discharge.   Respiratory: Negative for cough.   Skin: Negative for rash.  Neurological: Negative for dizziness, weakness, numbness and headaches.    Allergies  Review of patient's allergies indicates no known allergies.  Home Medications   Current Outpatient Rx  Name Route Sig Dispense Refill  . CITALOPRAM HYDROBROMIDE 20 MG PO TABS Oral Take 20 mg by mouth daily.      Marland Kitchen EZETIMIBE 10 MG PO TABS Oral Take 10 mg by mouth daily after supper.     . DEPLIN 15 MG PO TABS Oral Take 1 tablet by mouth daily.      Marland Kitchen ROSUVASTATIN CALCIUM 10 MG PO TABS Oral Take 40 mg by mouth daily after supper.     . TRAMADOL HCL 50 MG PO TABS Oral Take 100 mg by mouth 4 (four) times daily. Maximum dose= 8 tablets per day       BP 124/86  Pulse 74  Temp 98.2 F (36.8 C) (Oral)  Resp 16  SpO2 96%  Physical Exam  Nursing note and vitals  reviewed. Constitutional: He appears well-developed and well-nourished.  HENT:  Head: Normocephalic.  Right Ear: No drainage, swelling or tenderness. No decreased hearing is noted.  Left Ear: No drainage, swelling or tenderness. No decreased hearing is noted.  Ears:  Eyes: Conjunctivae normal are normal.  Neck: Neck supple. No JVD present.  Musculoskeletal: He exhibits no tenderness.  Lymphadenopathy:    He has no cervical adenopathy.  Neurological: He is alert.  Skin: No rash noted.    ED Course  Procedures (including critical care time)  Labs Reviewed - No data to display No results found.   No diagnosis found.    MDM          Jimmie Molly, MD 06/20/12 1318

## 2012-06-20 NOTE — ED Notes (Signed)
C/o of ear pain x 4 days Ear fullness x 10 days

## 2015-11-10 MED FILL — traMADol HCL 50 MG TABS: 50 | 30 days supply | Qty: 180 | Fill #2

## 2015-11-10 MED FILL — ZOLPIDEM TARTRATE 5 MG TAB: 5 | 30 days supply | Qty: 30 | Fill #2

## 2016-01-03 DIAGNOSIS — Z79891 Long term (current) use of opiate analgesic: Secondary | ICD-10-CM | POA: Diagnosis not present

## 2016-01-03 DIAGNOSIS — M62830 Muscle spasm of back: Secondary | ICD-10-CM | POA: Diagnosis not present

## 2016-01-03 DIAGNOSIS — M47812 Spondylosis without myelopathy or radiculopathy, cervical region: Secondary | ICD-10-CM | POA: Diagnosis not present

## 2016-01-03 DIAGNOSIS — G4700 Insomnia, unspecified: Secondary | ICD-10-CM | POA: Diagnosis not present

## 2016-01-03 DIAGNOSIS — G894 Chronic pain syndrome: Secondary | ICD-10-CM | POA: Diagnosis not present

## 2016-01-03 MED FILL — METHOCARBAMOL 500 MG TABLET: 500 | 90 days supply | Qty: 270 | Fill #0

## 2016-01-03 MED FILL — ZOLPIDEM TARTRATE 5 MG TAB: 5 | 30 days supply | Qty: 30 | Fill #0

## 2016-01-03 MED FILL — traMADol HCL 50 MG TABS: 50 | 30 days supply | Qty: 180 | Fill #0

## 2016-02-11 MED FILL — ZOLPIDEM TARTRATE 5 MG TAB: 5 | 30 days supply | Qty: 30 | Fill #1

## 2016-02-11 MED FILL — traMADol HCL 50 MG TABS: 50 | 30 days supply | Qty: 180 | Fill #1

## 2016-04-18 MED FILL — ZOLPIDEM TARTRATE 5 MG TAB: 5 | 30 days supply | Qty: 30 | Fill #2

## 2016-04-18 MED FILL — traMADol HCL 50 MG TABS: 50 | 30 days supply | Qty: 180 | Fill #2

## 2017-08-22 DIAGNOSIS — H524 Presbyopia: Secondary | ICD-10-CM | POA: Diagnosis not present

## 2019-11-21 ENCOUNTER — Ambulatory Visit: Payer: 59 | Attending: Internal Medicine

## 2019-11-21 ENCOUNTER — Ambulatory Visit: Payer: 59

## 2019-11-21 DIAGNOSIS — Z23 Encounter for immunization: Secondary | ICD-10-CM | POA: Insufficient documentation

## 2019-11-21 NOTE — Progress Notes (Signed)
   Covid-19 Vaccination Clinic  Name:  Brett Espinoza    MRN: [id number] DOB: Mar 31, 1966  11/21/2019  [name] was observed post Covid-19 immunization for 15 minutes without incidence. He was provided with Vaccine Information Sheet and instruction to access the V-Safe system.   [name] was instructed to call 911 with any severe reactions post vaccine: Marland Kitchen Difficulty breathing  . Swelling of your face and throat  . A fast heartbeat  . A bad rash all over your body  . Dizziness and weakness    Immunizations Administered    Name Date Dose VIS Date Route   Pfizer COVID-19 Vaccine 11/21/2019  3:31 PM 0.3 mL 09/19/2019 Intramuscular   Manufacturer: [employer], [employer]   Lot: [id number]   NDC: [address]-5

## 2019-12-16 ENCOUNTER — Ambulatory Visit: Payer: 59 | Attending: Internal Medicine

## 2019-12-16 DIAGNOSIS — Z23 Encounter for immunization: Secondary | ICD-10-CM | POA: Insufficient documentation

## 2019-12-16 NOTE — Progress Notes (Signed)
   Covid-19 Vaccination Clinic  Name:  Brett Espinoza    MRN: [id number] DOB: Jul 27, 1966  12/16/2019  [name] was observed post Covid-19 immunization for 15 minutes without incident. He was provided with Vaccine Information Sheet and instruction to access the V-Safe system.   [name] was instructed to call 911 with any severe reactions post vaccine: Marland Kitchen Difficulty breathing  . Swelling of face and throat  . A fast heartbeat  . A bad rash all over body  . Dizziness and weakness   Immunizations Administered    Name Date Dose VIS Date Route   Pfizer COVID-19 Vaccine 12/16/2019  3:04 PM 0.3 mL 09/19/2019 Intramuscular   Manufacturer: [employer], [employer]   Lot: [id number]   NDC: [address]-4

## 2020-04-08 ENCOUNTER — Ambulatory Visit (HOSPITAL_COMMUNITY)
Admission: EM | Admit: 2020-04-08 | Discharge: 2020-04-08 | Disposition: A | Discharge status: Home / Self Care | Payer: 59 | Attending: Internal Medicine | Admitting: Internal Medicine

## 2020-04-08 ENCOUNTER — Encounter (HOSPITAL_COMMUNITY): Payer: Self-pay

## 2020-04-08 ENCOUNTER — Ambulatory Visit (INDEPENDENT_AMBULATORY_CARE_PROVIDER_SITE_OTHER): Payer: 59

## 2020-04-08 DIAGNOSIS — S39012A Strain of muscle, fascia and tendon of lower back, initial encounter: Secondary | ICD-10-CM | POA: Diagnosis not present

## 2020-04-08 DIAGNOSIS — S161XXA Strain of muscle, fascia and tendon at neck level, initial encounter: Secondary | ICD-10-CM

## 2020-04-08 DIAGNOSIS — R202 Paresthesia of skin: Secondary | ICD-10-CM | POA: Diagnosis not present

## 2020-04-08 DIAGNOSIS — R200 Anesthesia of skin: Secondary | ICD-10-CM | POA: Diagnosis not present

## 2020-04-08 LAB — POCT URINALYSIS DIP (DEVICE)
Bilirubin Urine: NEGATIVE
Glucose, UA: NEGATIVE mg/dL
Hgb urine dipstick: NEGATIVE
Ketones, ur: NEGATIVE mg/dL
Leukocytes,Ua: NEGATIVE
Nitrite: NEGATIVE
Protein, ur: NEGATIVE mg/dL
Specific Gravity, Urine: 1.025 (ref 1.005–1.030)
Urobilinogen, UA: 0.2 mg/dL (ref 0.0–1.0)
pH: 5.5 (ref 5.0–8.0)

## 2020-04-08 MED ORDER — PREDNISONE 50 MG PO TABS: ORAL_TABLET | ORAL | 0 refills | Status: AC

## 2020-04-08 MED ORDER — KETOROLAC TROMETHAMINE 60 MG/2ML IM SOLN
60.0000 mg | Freq: Once | INTRAMUSCULAR | Status: AC
  Administered 2020-04-08: 60 mg via INTRAMUSCULAR

## 2020-04-08 MED ORDER — KETOROLAC TROMETHAMINE 60 MG/2ML IM SOLN
INTRAMUSCULAR | Status: AC
  Filled 2020-04-08: qty 2

## 2020-04-08 MED ORDER — TRAMADOL HCL 50 MG PO TABS: 50.0000 mg | ORAL_TABLET | Freq: Four times a day (QID) | ORAL | 0 refills | Status: AC | PRN
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
## 2020-04-08 NOTE — Discharge Instructions (Addendum)
Use frequent heat to sore areas.  The prednisone is for the numbness and tingling.  Take until gone.  No driving when taking tramadol.

## 2020-06-28 ENCOUNTER — Other Ambulatory Visit: Payer: Self-pay | Admitting: Internal Medicine

## 2020-06-28 DIAGNOSIS — R161 Splenomegaly, not elsewhere classified: Secondary | ICD-10-CM

## 2020-07-08 ENCOUNTER — Other Ambulatory Visit: Payer: 59

## 2020-07-22 ENCOUNTER — Ambulatory Visit
Admission: RE | Admit: 2020-07-22 | Discharge: 2020-07-22 | Disposition: A | Discharge status: Home / Self Care | Payer: 59 | Source: Ambulatory Visit | Attending: Internal Medicine | Admitting: Internal Medicine

## 2020-07-22 DIAGNOSIS — R161 Splenomegaly, not elsewhere classified: Secondary | ICD-10-CM

## 2020-07-22 MED ORDER — IOPAMIDOL (ISOVUE-300) INJECTION 61%
100.0000 mL | Freq: Once | INTRAVENOUS | Status: AC | PRN
  Administered 2020-07-22: 100 mL via INTRAVENOUS

## 2021-08-17 ENCOUNTER — Encounter (HOSPITAL_COMMUNITY): Payer: Self-pay | Admitting: Emergency Medicine

## 2021-08-17 ENCOUNTER — Other Ambulatory Visit: Payer: Self-pay

## 2021-08-17 ENCOUNTER — Ambulatory Visit (HOSPITAL_COMMUNITY)
Admission: EM | Admit: 2021-08-17 | Discharge: 2021-08-17 | Disposition: A | Discharge status: Home / Self Care | Payer: 59 | Attending: Family Medicine | Admitting: Family Medicine

## 2021-08-17 DIAGNOSIS — R1011 Right upper quadrant pain: Secondary | ICD-10-CM | POA: Insufficient documentation

## 2021-08-17 LAB — CBC WITH DIFFERENTIAL/PLATELET
Abs Immature Granulocytes: 0.06 10*3/uL (ref 0.00–0.07)
Basophils Absolute: 0.2 10*3/uL — ABNORMAL HIGH (ref 0.0–0.1)
Basophils Relative: 2 %
Eosinophils Absolute: 0.1 10*3/uL (ref 0.0–0.5)
Eosinophils Relative: 1 %
HCT: 55.9 % — ABNORMAL HIGH (ref 39.0–52.0)
Hemoglobin: 19.9 g/dL — ABNORMAL HIGH (ref 13.0–17.0)
Immature Granulocytes: 1 %
Lymphocytes Relative: 12 %
Lymphs Abs: 1.5 10*3/uL (ref 0.7–4.0)
MCH: 32.9 pg (ref 26.0–34.0)
MCHC: 35.6 g/dL (ref 30.0–36.0)
MCV: 92.5 fL (ref 80.0–100.0)
Monocytes Absolute: 0.9 10*3/uL (ref 0.1–1.0)
Monocytes Relative: 7 %
Neutro Abs: 9.5 10*3/uL — ABNORMAL HIGH (ref 1.7–7.7)
Neutrophils Relative %: 77 %
Platelets: 343 10*3/uL (ref 150–400)
RBC: 6.04 MIL/uL — ABNORMAL HIGH (ref 4.22–5.81)
RDW: 13.9 % (ref 11.5–15.5)
WBC: 12.2 10*3/uL — ABNORMAL HIGH (ref 4.0–10.5)
nRBC: 0 % (ref 0.0–0.2)

## 2021-08-17 LAB — COMPREHENSIVE METABOLIC PANEL
ALT: 56 U/L — ABNORMAL HIGH (ref 0–44)
AST: 33 U/L (ref 15–41)
Albumin: 4.7 g/dL (ref 3.5–5.0)
Alkaline Phosphatase: 76 U/L (ref 38–126)
Anion gap: 10 (ref 5–15)
BUN: 8 mg/dL (ref 6–20)
CO2: 25 mmol/L (ref 22–32)
Calcium: 9.2 mg/dL (ref 8.9–10.3)
Chloride: 101 mmol/L (ref 98–111)
Creatinine, Ser: 1.09 mg/dL (ref 0.61–1.24)
GFR, Estimated: >60 mL/min (ref >60)
Glucose, Bld: 91 mg/dL (ref 70–99)
Potassium: 4.2 mmol/L (ref 3.5–5.1)
Sodium: 136 mmol/L (ref 135–145)
Total Bilirubin: 1.9 mg/dL — ABNORMAL HIGH (ref 0.3–1.2)
Total Protein: 8.3 g/dL — ABNORMAL HIGH (ref 6.5–8.1)

## 2021-08-17 LAB — LIPASE, BLOOD: Lipase: 42 U/L (ref 11–51)

## 2021-08-17 NOTE — ED Notes (Signed)
Patient is being discharged from the Urgent Care and sent to the Emergency Department via pov . Per Dr Tracie Harrier, patient is in need of higher level of care due to limited diagnostic tools. Patient is aware and verbalizes understanding of plan of care.  Vitals:   08/17/21 1505  BP: 138/89  Pulse: 88  Resp: 18  Temp: 98.3 F (36.8 C)  SpO2: 94%

## 2021-08-17 NOTE — ED Triage Notes (Signed)
Abdominal pain started 2 months ago, worsened yesterday and feels like pain experienced when gallbladder removed.  Pain is under right lower ribcage.  Patient has had nausea, no vomiting

## 2021-08-18 NOTE — ED Provider Notes (Signed)
[facility]   [id number] 08/17/21 Arrival Time: 1345  ASSESSMENT & PLAN:  1. Right upper quadrant abdominal pain    Appears to be in significant pain. Unable to image here. He agrees to ED eval. Blood work drawn and is pending. Stable upon d/c.   Follow-up Information     Go to  [facility] EMERGENCY DEPARTMENT.   Specialty: Emergency Medicine Contact information: [address] [id number] Wilhemina Bonito [place] [address] [phone]                 Reviewed expectations re: course of current medical issues. Questions answered. Outlined signs and symptoms indicating need for more acute intervention. Patient verbalized understanding. After Visit Summary given.   SUBJECTIVE: History from: patient. TEMITAYO COVALT is a 55 y.o. male who presents with complaint of non-radiating RUQ abd pain; on/off past 2 months; much worse today. H/O cholecystectomy. Afebrile. Without n/v/d. No back pain. Normal bowel/bladder habits.  Past Surgical History:  Procedure Laterality Date   CHOLECYSTECTOMY  09/20/2011   Procedure: LAPAROSCOPIC CHOLECYSTECTOMY;  Surgeon: Iona Coach, MD;  Location: [facility];  Service: General;  Laterality: N/A;  with ioc     OBJECTIVE:  Vitals:   08/17/21 1505  BP: 138/89  Pulse: 88  Resp: 18  Temp: 98.3 F (36.8 C)  TempSrc: Oral  SpO2: 94%    General appearance: alert, oriented, no acute distress but appears to be in pain HEENT: ; AT; oropharynx moist Lungs: unlabored respirations Abdomen: soft; without distention; moderate and poorly localized tenderness to palpation over RUQ ; without masses or organomegaly; without guarding or rebound tenderness Back: without reported CVA tenderness; FROM at waist Extremities: without LE edema; symmetrical; without gross deformities Skin: warm and dry Neurologic: normal gait Psychological: alert and cooperative; normal mood and affect  CBC, CMP, Lipase  pending.  No Known Allergies                                             Past Medical History:  Diagnosis Date   Back pain    Back pain    Depression    Hypercholesterolemia     Social History   Socioeconomic History   Marital status: Married    Spouse name: Not on file   Number of children: Not on file   Years of education: Not on file   Highest education level: Not on file  Occupational History   Not on file  Tobacco Use   Smoking status: Never   Smokeless tobacco: Never  Vaping Use   Vaping Use: Never used  Substance and Sexual Activity   Alcohol use: Yes    Alcohol/week: 6.0 standard drinks    Types: 3 Glasses of wine, 3 Cans of beer per week   Drug use: No   Sexual activity: Not on file  Other Topics Concern   Not on file  Social History Narrative   Not on file   Social Determinants of Health   Financial Resource Strain: Not on file  Food Insecurity: Not on file  Transportation Needs: Not on file  Physical Activity: Not on file  Stress: Not on file  Social Connections: Not on file  Intimate Partner Violence: Not on file    Family History  Problem Relation Age of Onset   Cancer Mother  bladder   Cancer Father        prostate   Heart disease Father    Heart disease Paternal Guido Sander, MD 08/18/21 [phone]

## 2021-08-31 ENCOUNTER — Other Ambulatory Visit: Payer: Self-pay | Admitting: Internal Medicine

## 2021-08-31 DIAGNOSIS — R1011 Right upper quadrant pain: Secondary | ICD-10-CM

## 2021-09-26 ENCOUNTER — Ambulatory Visit
Admission: RE | Admit: 2021-09-26 | Discharge: 2021-09-26 | Disposition: A | Discharge status: Home / Self Care | Payer: 59 | Source: Ambulatory Visit | Attending: Internal Medicine | Admitting: Internal Medicine

## 2021-09-26 DIAGNOSIS — R1011 Right upper quadrant pain: Secondary | ICD-10-CM

## 2021-09-26 MED ORDER — IOPAMIDOL (ISOVUE-300) INJECTION 61%
100.0000 mL | Freq: Once | INTRAVENOUS | Status: AC | PRN
  Administered 2021-09-26: 11:00:00 100 mL via INTRAVENOUS
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
